# Patient Record
Sex: Male | Born: 1960 | Hispanic: Yes | Smoking: Former smoker
Health system: Southern US, Community
[De-identification: ages and names within clinical notes are randomized; demographics above are authoritative.]

## PROBLEM LIST (undated history)

## (undated) DIAGNOSIS — Z951 Presence of aortocoronary bypass graft: Secondary | ICD-10-CM

## (undated) DIAGNOSIS — C9590 Leukemia, unspecified not having achieved remission: Secondary | ICD-10-CM

## (undated) HISTORY — PX: CORONARY ARTERY BYPASS GRAFT: SHX141

## (undated) HISTORY — PX: KNEE SURGERY: SHX244

## (undated) HISTORY — PX: SHOULDER SURGERY: SHX246

---

## 2014-09-09 ENCOUNTER — Emergency Department: Payer: Self-pay | Admitting: Emergency Medicine

## 2014-09-09 LAB — COMPREHENSIVE METABOLIC PANEL
ALK PHOS: 142 U/L — AB
ANION GAP: 8 (ref 7–16)
Albumin: 3.5 g/dL (ref 3.4–5.0)
BILIRUBIN TOTAL: 0.4 mg/dL (ref 0.2–1.0)
BUN: 16 mg/dL (ref 7–18)
CALCIUM: 8.2 mg/dL — AB (ref 8.5–10.1)
CHLORIDE: 103 mmol/L (ref 98–107)
CREATININE: 1.13 mg/dL (ref 0.60–1.30)
Co2: 27 mmol/L (ref 21–32)
EGFR (African American): >60
GLUCOSE: 104 mg/dL — AB (ref 65–99)
Osmolality: 277 (ref 275–301)
POTASSIUM: 3.6 mmol/L (ref 3.5–5.1)
SGOT(AST): 25 U/L (ref 15–37)
SGPT (ALT): 26 U/L
Sodium: 138 mmol/L (ref 136–145)
Total Protein: 7.8 g/dL (ref 6.4–8.2)

## 2014-09-09 LAB — CBC
HCT: 34.6 % — ABNORMAL LOW (ref 40.0–52.0)
HGB: 10.6 g/dL — ABNORMAL LOW (ref 13.0–18.0)
MCH: 23.6 pg — AB (ref 26.0–34.0)
MCHC: 30.7 g/dL — AB (ref 32.0–36.0)
MCV: 77 fL — AB (ref 80–100)
PLATELETS: 249 10*3/uL (ref 150–440)
RBC: 4.49 10*6/uL (ref 4.40–5.90)
RDW: 18 % — AB (ref 11.5–14.5)
WBC: 4.6 10*3/uL (ref 3.8–10.6)

## 2014-09-09 LAB — CK TOTAL AND CKMB (NOT AT ARMC)
CK, TOTAL: 166 U/L (ref 39–308)
CK-MB: 1 ng/mL (ref 0.5–3.6)

## 2014-09-09 LAB — TROPONIN I: Troponin-I: <0.02 ng/mL

## 2014-09-09 LAB — PROTIME-INR
INR: 1
PROTHROMBIN TIME: 13.1 s (ref 11.5–14.7)

## 2014-09-09 LAB — APTT: Activated PTT: 33.5 secs (ref 23.6–35.9)

## 2014-09-09 LAB — LIPASE, BLOOD: Lipase: 191 U/L (ref 73–393)

## 2014-09-09 LAB — MAGNESIUM: Magnesium: 1.7 mg/dL — ABNORMAL LOW

## 2014-09-10 LAB — TROPONIN I: Troponin-I: <0.02 ng/mL

## 2014-09-16 ENCOUNTER — Emergency Department: Payer: Self-pay | Admitting: Internal Medicine

## 2016-04-25 IMAGING — CR DG SHOULDER 3+V*L*
1 series · 3 of 3 positions shown · non-contrast
Comparison: None

CLINICAL DATA: Fell from skateboard today onto LEFT side, LEFT
shoulder pain, limited range of motion, initial encounter

EXAM:
DG SHOULDER 3+VIEWS LEFT

[Series 1: dxr shoulder left complete · 0.14mm/px · 3 of 3 slices shown]
[im 1/3]
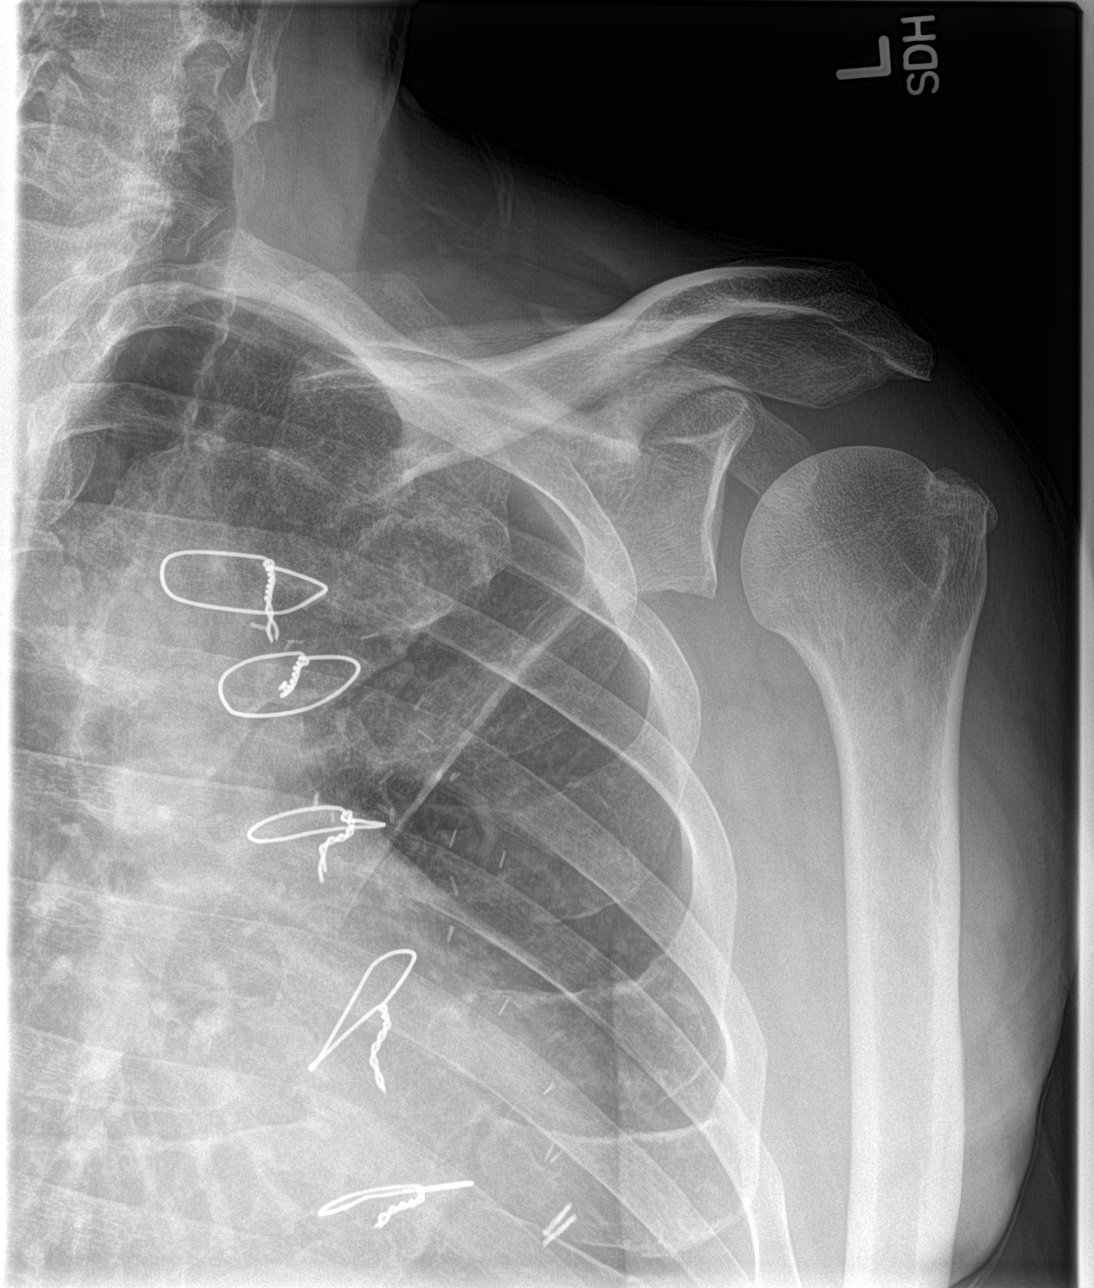
[im 2/3]
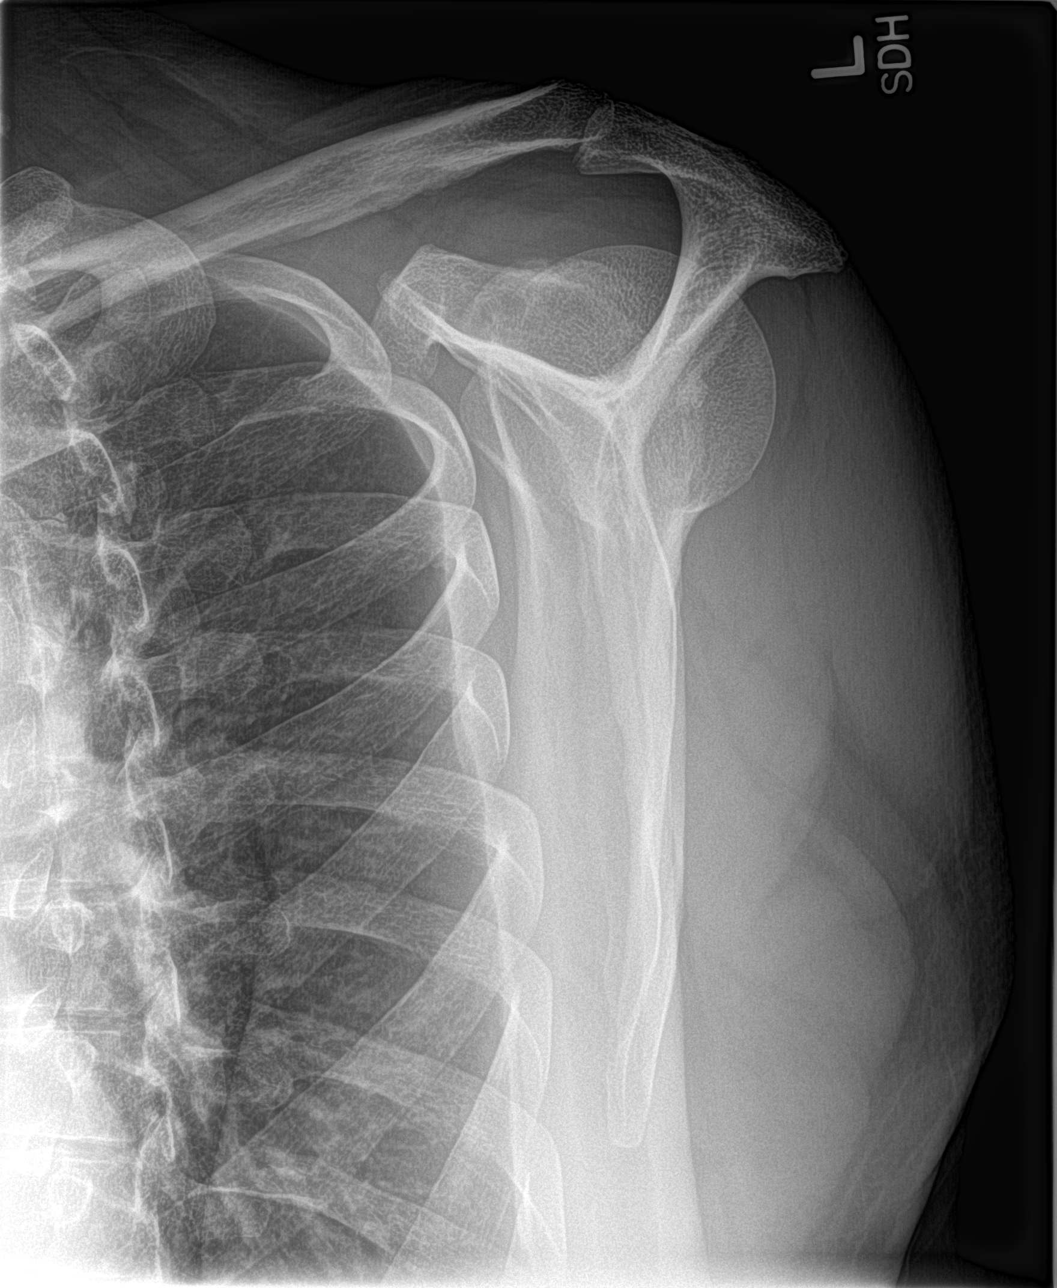
[im 3/3]
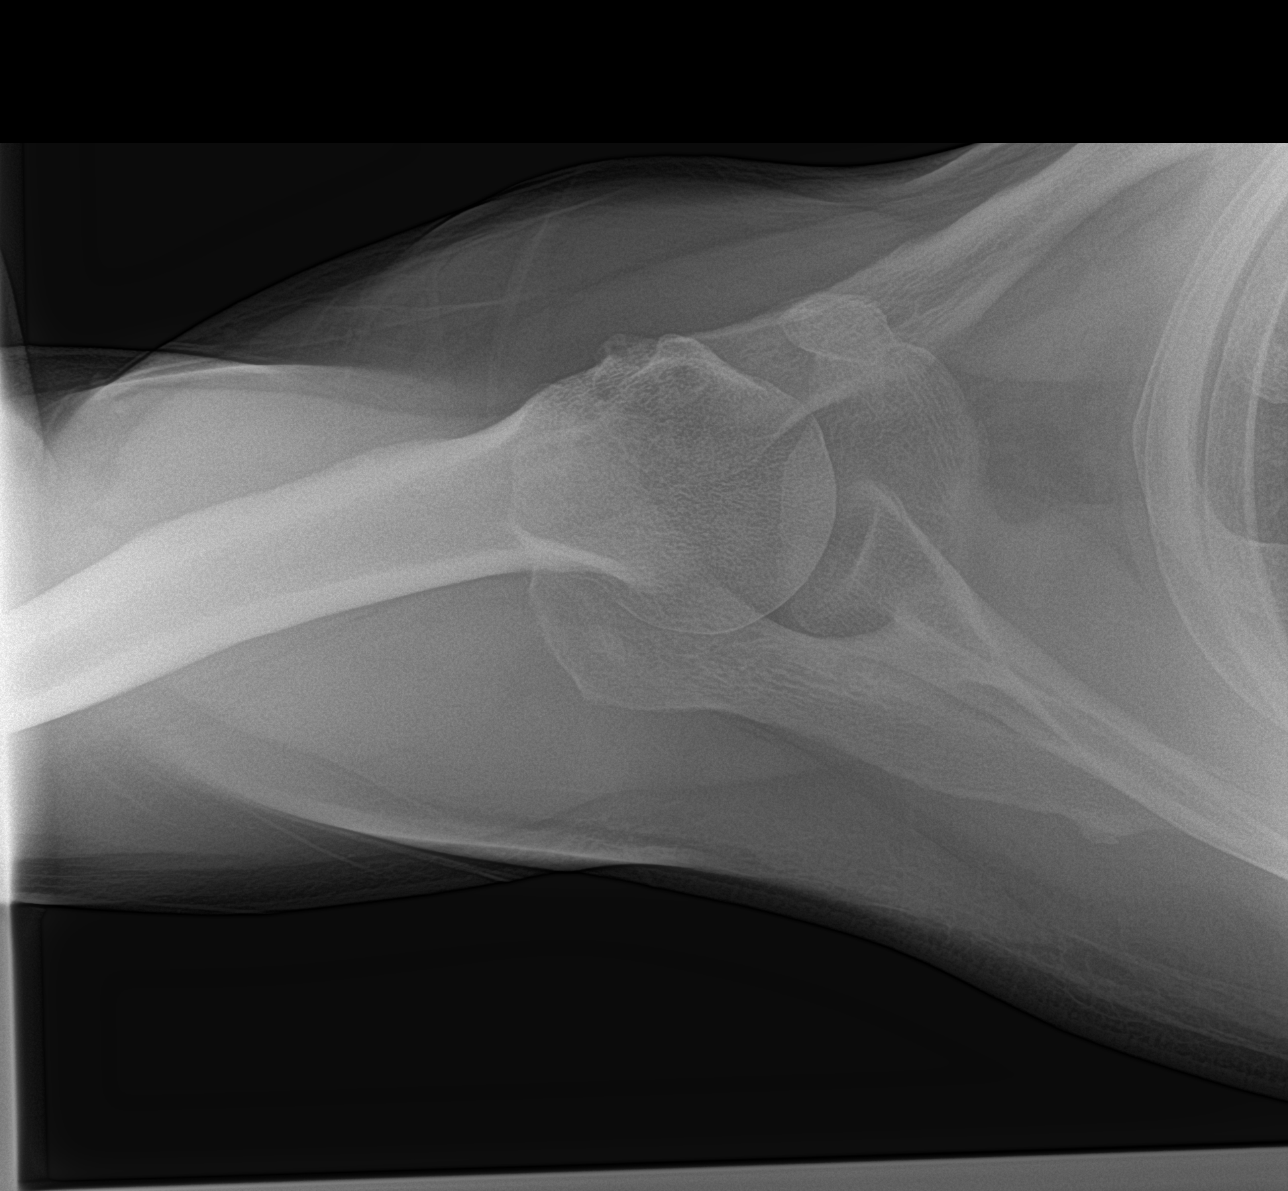

[3 of 3 positions shown; findings below may reference images not displayed]

FINDINGS: Osseous mineralization probably normal for technique.

AC joint alignment normal.

No acute fracture, dislocation, or bone destruction.

Visualized LEFT ribs intact.

Prior median sternotomy.
IMPRESSION: No acute LEFT shoulder abnormalities.

## 2019-09-20 ENCOUNTER — Encounter (HOSPITAL_COMMUNITY): Payer: Self-pay | Admitting: Emergency Medicine

## 2019-09-20 ENCOUNTER — Emergency Department (HOSPITAL_COMMUNITY)
Admission: EM | Admit: 2019-09-20 | Discharge: 2019-09-20 | Disposition: A | Discharge status: Home / Self Care | Payer: Self-pay | Attending: Emergency Medicine | Admitting: Emergency Medicine

## 2019-09-20 ENCOUNTER — Other Ambulatory Visit: Payer: Self-pay

## 2019-09-20 DIAGNOSIS — Z856 Personal history of leukemia: Secondary | ICD-10-CM | POA: Insufficient documentation

## 2019-09-20 DIAGNOSIS — Z951 Presence of aortocoronary bypass graft: Secondary | ICD-10-CM | POA: Insufficient documentation

## 2019-09-20 DIAGNOSIS — K649 Unspecified hemorrhoids: Secondary | ICD-10-CM | POA: Insufficient documentation

## 2019-09-20 DIAGNOSIS — R42 Dizziness and giddiness: Secondary | ICD-10-CM | POA: Insufficient documentation

## 2019-09-20 DIAGNOSIS — Z79899 Other long term (current) drug therapy: Secondary | ICD-10-CM | POA: Insufficient documentation

## 2019-09-20 HISTORY — DX: Presence of aortocoronary bypass graft: Z95.1

## 2019-09-20 HISTORY — DX: Leukemia, unspecified not having achieved remission: C95.90

## 2019-09-20 LAB — CBC
HCT: 34.6 % — ABNORMAL LOW (ref 39.0–52.0)
Hemoglobin: 10.5 g/dL — ABNORMAL LOW (ref 13.0–17.0)
MCH: 27.1 pg (ref 26.0–34.0)
MCHC: 30.3 g/dL (ref 30.0–36.0)
MCV: 89.2 fL (ref 80.0–100.0)
Platelets: 325 10*3/uL (ref 150–400)
RBC: 3.88 MIL/uL — ABNORMAL LOW (ref 4.22–5.81)
RDW: 15.3 % (ref 11.5–15.5)
WBC: 7.2 10*3/uL (ref 4.0–10.5)
nRBC: 0 % (ref 0.0–0.2)

## 2019-09-20 LAB — COMPREHENSIVE METABOLIC PANEL
ALT: 20 U/L (ref 0–44)
AST: 25 U/L (ref 15–41)
Albumin: 4.5 g/dL (ref 3.5–5.0)
Alkaline Phosphatase: 80 U/L (ref 38–126)
Anion gap: 11 (ref 5–15)
BUN: 11 mg/dL (ref 6–20)
CO2: 24 mmol/L (ref 22–32)
Calcium: 9.9 mg/dL (ref 8.9–10.3)
Chloride: 105 mmol/L (ref 98–111)
Creatinine, Ser: 0.93 mg/dL (ref 0.61–1.24)
GFR calc Af Amer: >60 mL/min (ref >60)
GFR calc non Af Amer: >60 mL/min (ref >60)
Glucose, Bld: 118 mg/dL — ABNORMAL HIGH (ref 70–99)
Potassium: 4.2 mmol/L (ref 3.5–5.1)
Sodium: 140 mmol/L (ref 135–145)
Total Bilirubin: 0.8 mg/dL (ref 0.3–1.2)
Total Protein: 8.1 g/dL (ref 6.5–8.1)

## 2019-09-20 LAB — TYPE AND SCREEN
ABO/RH(D): O POS
Antibody Screen: NEGATIVE

## 2019-09-20 LAB — ABO/RH: ABO/RH(D): O POS

## 2019-09-20 NOTE — Discharge Instructions (Signed)
Stop Plavix for 7 days.  Cmo tomar un bao de asiento How to Take a [employer] Un bao de asiento es un bao de agua tibia que se puede usar para cuidar el recto, la zona genital o la zona entre el recto y los genitales (perineo). En un bao de asiento, el agua solamente llega [employer] caderas y [place] las nalgas. Un bao de asiento puede [employer] hogar en la baera o en una tina porttil para bao de asiento que se coloca sobre el inodoro. Su mdico puede recomendar un bao de asiento para ayudarlo con lo siguiente:  Best boy y las molestias despus de dar a Actuary.  Aliviar el dolor y la picazn causados por las hemorroides o las fisuras anales.  Aliviar el dolor despus de determinadas cirugas.  Relajar los msculos doloridos o tensos. Cmo tomar un bao de [employer] 3 o 4baos de asiento diarios o tantos como se lo haya indicado el mdico. Bao de asiento en la baera Para tomar un bao de asiento en una baera: 1. Llene parte de la baera con agua tibia. El agua debe tener la profundidad suficiente para cubrirle las caderas y las nalgas cuando est sentado en la baera. 2. Si su mdico le indic que ponga medicamentos en el agua, siga sus instrucciones. 3. Sintese en el agua. 4. Sherlon Handing un poco el drenaje de la baera y djelo abierto durante su bao. 5. Abra el agua tibia nuevamente, lo suficiente para reponer Youth worker. Deje correr el agua durante todo su bao. Esto ayuda a Engineer, manufacturing systems en el nivel adecuado y a Scientist, forensic. 6. Sumrjase en el agua entre 15 y 31 minutos, o el tiempo que le haya indicado el mdico. 7. Cuando termine, tenga cuidado al ponerse de pie. Puede sentirse mareado. 8. Luego del bao de asiento, squese con golpecitos suaves. No frote la piel para secarla.  Bao de asiento sobre el inodoro Para tomar un bao de asiento con un recipiente sobre el inodoro: 1. Siga las instrucciones del fabricante. 2. Llene el recipiente  con agua tibia. 3. Si su mdico le indic que ponga medicamentos en el agua, siga sus instrucciones. 4. Sintese en el asiento. Asegrese de que el agua le cubra las nalgas y el perineo. 5. Sumrjase en el agua entre 15 y 58 minutos, o el tiempo que le haya indicado el mdico. 6. Luego del bao de asiento, squese con golpecitos suaves. No frote la piel para secarla. 7. Limpie y seque la tina despus de cada uso. 8. Deseche el recipiente si se agrieta o segn las instrucciones del fabricante. Comunquese con un mdico si:  Los sntomas empeoran. No contine con los baos de asiento si sus sntomas empeoran.  Aparecen nuevos sntomas. Si esto ocurre, no contine con los baos de asiento hasta hablar con su mdico. Resumen  Un bao de asiento es un bao con agua tibia en el cual el agua solo le llega hasta la cadera y cubre las nalgas.  Un bao de asiento puede [employer] picazn y Conservation officer, historic buildings, y Media planner los msculos doloridos o tensos en la parte inferior del cuerpo, incluida la zona genital.  Fidelis 3 o 4baos de asiento diarios o tantos como se lo haya indicado el mdico. Sumrjase en el agua entre 15 y 42 minutos.  No contine con los baos de asiento si los sntomas empeoran. Esta informacin no tiene Marine scientist el consejo del mdico. Asegrese de  hacerle al mdico cualquier pregunta que tenga. Document Released: 10/21/2006 Document Revised: 10/18/2017 Document Reviewed: 10/18/2017 Elsevier Patient Education  2020 [place] hemorroides son venas inflamadas que pueden desarrollarse:  En el ano (recto). Estas se denominan hemorroides internas.  Alrededor de la abertura del ano. Estas se denominan hemorroides externas. Las hemorroides pueden causar dolor, picazn o hemorragias. Generalmente no causan problemas graves. Con frecuencia mejoran al [employer] dieta, el estilo de vida y otros tratamientos Financial planner. Cules son las causas? Esta  afeccin puede ser causada por lo siguiente:  Tener dificultad para defecar (estreimiento).  Hacer mucha fuerza (esfuerzo) para defecar.  Materia fecal lquida (diarrea).  Embarazo.  Tener mucho sobrepeso (obesidad).  Estar sentado durante largos perodos de [place].  Levantar objetos pesados u otras actividades que impliquen esfuerzo.  Sexo anal.  Andar en bicicleta por un largo perodo de tiempo. Cules son los signos o los sntomas? Los sntomas de esta afeccin incluyen los siguientes:  Social research officer, government.  Picazn o irritacin en el ano.  Sangrado proveniente del ano.  Prdida de materia fecal.  Inflamacin en la zona.  Uno o ms bultos alrededor de la abertura del ano. Cmo se diagnostica? A menudo un mdico puede diagnosticar esta afeccin al observar la zona afectada. El mdico tambin puede:  Optometrist un examen que implica palpar la zona con la mano enguantada (examen rectal digital).  Examinar el interior de la zona anal utilizando un pequeo tubo (anoscopio).  Pedir anlisis de [place]. Es posible que esto se realice si ha perdido Optometrist.  Solicitarle un estudio que consiste en la observacin del interior del colon utilizando un tubo flexible con una cmara en el extremo (sigmoidoscopia o colonoscopa). Cmo se trata? Esta afeccin generalmente se puede tratar en el hogar. El mdico puede indicarle que cambie de Designer, multimedia, de estilo de vida o que trate de Physicist, medical. Si esto no da resultado, se pueden realizar procedimientos para extirpar las hemorroides o reducir Publishing rights manager. Estos pueden implicar lo siguiente:  Building services engineer en la base de las hemorroides para interrumpir la irrigacin de Herbalist.  Inyectar un medicamento en las hemorroides para reducir Publishing rights manager.  Dirigir un tipo de Teacher, early years/pre de luz hacia las hemorroides para Field seismologist que se caigan.  Realizar Ardelia Mems ciruga para extirpar las hemorroides o cortar la irrigacin de  [place]. Siga estas indicaciones en su casa: Comida y bebida   Consuma alimentos con alto contenido de [place]. Entre ellos cereales integrales, frijoles, frutos secos, frutas y verduras.  Pregntele a su mdico acerca de tomar productos con fibra aadida en ellos (complementos defibra).  Disminuya la cantidad de grasa de la dieta. Para esto, puede hacer lo siguiente: ? Coma productos lcteos descremados. ? Coma menos carne roja. ? No consuma alimentos procesados.  Beba suficiente lquido para Consulting civil engineer orina de color amarillo plido. Control del dolor y [place] un bao de agua tibia (bao de asiento) durante 20 minutos para Best boy. Hgalo 3 o 4veces al da. Puede hacer esto en una baera o usar un dispositivo porttil para bao de asiento que se coloca sobre el inodoro.  Si se lo indican, aplique hielo sobre la zona dolorida. Puede ser beneficioso aplicar hielo [employer] baos con agua tibia. ? Ponga el hielo en una bolsa plstica. ? Coloque una [employer] piel y Therapist, nutritional. ? Coloque el hielo durante 73minutos, 2 a 3veces por da.  Indicaciones generales  [employer] de venta libre y los recetados solamente como se lo haya indicado el mdico. ? Las [employer] y los medicamentos pueden usarse como se lo hayan indicado.  Haga ejercicio fsico con frecuencia. Consulte al mdico qu tipos de ejercicios son mejores para usted y qu cantidad.  Vaya al bao cuando sienta ganas de defecar. No espere.  Evite hacer demasiada fuerza al defecar.  Mantenga el ano seco y limpio. Use papel higinico hmedo o toallitas humedecidas despus de defecar.  No pase mucho tiempo sentado en el inodoro.  Concurra a todas las visitas de seguimiento como se lo haya indicado el mdico. Esto es importante. Comunquese con un mdico si:  Tiene dolor e hinchazn que no mejoran con el tratamiento o los medicamentos.  Tiene problemas para defecar.  No puede  defecar.  Tiene dolor o hinchazn en la zona exterior de las hemorroides. Solicite ayuda inmediatamente si tiene:  Hemorragia que no se detiene. Resumen  Las hemorroides son venas hinchadas en el ano o la zona que rodea el ano.  Pueden causar dolor, picazn o sangrado.  Consuma alimentos con alto contenido de [place]. Entre ellos cereales integrales, frijoles, frutos secos, frutas y verduras.  Tome un bao de agua tibia (bao de asiento) durante 20 minutos para Best boy. Hgalo 3 o 4veces al da. Esta informacin no tiene Marine scientist el consejo del mdico. Asegrese de hacerle al mdico cualquier pregunta que tenga. Document Released: 01/04/2013 Document Revised: 03/19/2018 Document Reviewed: 03/19/2018 Elsevier Patient Education  2020 [place] hemorroides son venas inflamadas que pueden desarrollarse:  En el ano (recto). Estas se denominan hemorroides internas.  Alrededor de la abertura del ano. Estas se denominan hemorroides externas. Las hemorroides pueden causar dolor, picazn o hemorragias. Generalmente no causan problemas graves. Con frecuencia mejoran al [employer] dieta, el estilo de vida y otros tratamientos Financial planner. Cules son las causas? Esta afeccin puede ser causada por lo siguiente:  Tener dificultad para defecar (estreimiento).  Hacer mucha fuerza (esfuerzo) para defecar.  Materia fecal lquida (diarrea).  Embarazo.  Tener mucho sobrepeso (obesidad).  Estar sentado durante largos perodos de [place].  Levantar objetos pesados u otras actividades que impliquen esfuerzo.  Sexo anal.  Andar en bicicleta por un largo perodo de tiempo. Cules son los signos o los sntomas? Los sntomas de esta afeccin incluyen los siguientes:  Social research officer, government.  Picazn o irritacin en el ano.  Sangrado proveniente del ano.  Prdida de materia fecal.  Inflamacin en la zona.  Uno o ms bultos alrededor de la  abertura del ano. Cmo se diagnostica? A menudo un mdico puede diagnosticar esta afeccin al observar la zona afectada. El mdico tambin puede:  Optometrist un examen que implica palpar la zona con la mano enguantada (examen rectal digital).  Examinar el interior de la zona anal utilizando un pequeo tubo (anoscopio).  Pedir anlisis de [place]. Es posible que esto se realice si ha perdido Optometrist.  Solicitarle un estudio que consiste en la observacin del interior del colon utilizando un tubo flexible con una cmara en el extremo (sigmoidoscopia o colonoscopa). Cmo se trata? Esta afeccin generalmente se puede tratar en el hogar. El mdico puede indicarle que cambie de Designer, multimedia, de estilo de vida o que trate de Physicist, medical. Si esto no da resultado, se pueden realizar procedimientos para extirpar las hemorroides o reducir Publishing rights manager. Estos pueden implicar lo siguiente:  Glass blower/designer  bandas elsticas en la base de las hemorroides para interrumpir la irrigacin de la [place].  Inyectar un medicamento en las hemorroides para reducir Publishing rights manager.  Dirigir un tipo de Teacher, early years/pre de luz hacia las hemorroides para Field seismologist que se caigan.  Realizar Ardelia Mems ciruga para extirpar las hemorroides o cortar la irrigacin de [place]. Siga estas indicaciones en su casa: Comida y bebida   Consuma alimentos con alto contenido de [employer]. Entre ellos cereales integrales, frijoles, frutos secos, frutas y verduras.  Pregntele a su mdico acerca de tomar productos con fibra aadida en ellos (complementos defibra).  Disminuya la cantidad de grasa de la dieta. Para esto, puede hacer lo siguiente: ? Coma productos lcteos descremados. ? Coma menos carne roja. ? No consuma alimentos procesados.  Beba suficiente lquido para Consulting civil engineer orina de color amarillo plido. Control del dolor y [place] un bao de agua tibia (bao de asiento) durante 20 minutos para Best boy.  Hgalo 3 o 4veces al da. Puede hacer esto en una baera o usar un dispositivo porttil para bao de asiento que se coloca sobre el inodoro.  Si se lo indican, aplique hielo sobre la zona dolorida. Puede ser beneficioso aplicar hielo [employer] baos con agua tibia. ? Ponga el hielo en una bolsa plstica. ? Coloque una [employer] piel y Therapist, nutritional. ? Coloque el hielo durante 68minutos, 2 a 3veces por da. Indicaciones generales  [employer] de venta libre y los recetados solamente como se lo haya indicado el mdico. ? Las [employer] y los medicamentos pueden usarse como se lo hayan indicado.  Haga ejercicio fsico con frecuencia. Consulte al mdico qu tipos de ejercicios son mejores para usted y qu cantidad.  Vaya al bao cuando sienta ganas de defecar. No espere.  Evite hacer demasiada fuerza al defecar.  Mantenga el ano seco y limpio. Use papel higinico hmedo o toallitas humedecidas despus de defecar.  No pase mucho tiempo sentado en el inodoro.  Concurra a todas las visitas de seguimiento como se lo haya indicado el mdico. Esto es importante. Comunquese con un mdico si:  Tiene dolor e hinchazn que no mejoran con el tratamiento o los medicamentos.  Tiene problemas para defecar.  No puede defecar.  Tiene dolor o hinchazn en la zona exterior de las hemorroides. Solicite ayuda inmediatamente si tiene:  Hemorragia que no se detiene. Resumen  Las hemorroides son venas hinchadas en el ano o la zona que rodea el ano.  Pueden causar dolor, picazn o sangrado.  Consuma alimentos con alto contenido de [place]. Entre ellos cereales integrales, frijoles, frutos secos, frutas y verduras.  Tome un bao de agua tibia (bao de asiento) durante 20 minutos para Best boy. Hgalo 3 o 4veces al da. Esta informacin no tiene Marine scientist el consejo del mdico. Asegrese de hacerle al mdico cualquier pregunta que tenga. Document Released:  01/04/2013 Document Revised: 03/19/2018 Document Reviewed: 03/19/2018 Elsevier Patient Education  2020 [place]  The rectum is the last foot of your colon, and it naturally stretches to hold stool.  Hemorrhoidal piles are natural clusters of blood vessels that help the rectum and anal canal stretch to hold stool and allow bowel movements to eliminate feces.   Hemorrhoids are abnormally swollen blood vessels in the rectum.  Too much pressure in the rectum causes hemorrhoids by forcing blood to stretch and bulge the walls of the veins, sometimes even rupturing them.  Hemorrhoids can become  like varicose veins you might see on a person's legs.  Most people will develop a flare of hemorrhoids in their lifetime.  When bulging hemorrhoidal veins are irritated, they can swell, burn, itch, cause pain, and bleed.  Most flares will calm down gradually own within a few weeks.  However, once hemorrhoids are created, they are difficult to get rid of completely and tend to flare more easily than the first flare.   Fortunately, good habits and simple medical treatment usually control hemorrhoids well, and surgery is needed only in severe cases. Types of Hemorrhoids:  Internal hemorrhoids usually don't initially hurt or itch; they are deep inside the rectum and usually have no sensation. If they begin to push out (prolapse), pain and burning can occur.  However, internal hemorrhoids can bleed.  Anal bleeding should not be ignored since bleeding could come from a dangerous source like colorectal cancer, so persistent rectal bleeding should be investigated by a doctor, sometimes with a colonoscopy.  External hemorrhoids cause most of the symptoms - pain, burning, and itching. Nonirritated hemorrhoids can look like small skin tags coming out of the anus.   Thrombosed hemorrhoids can form when a hemorrhoid blood vessel bursts and causes the hemorrhoid to suddenly swell.  A purple blood clot can form in it  and become an excruciatingly painful lump at the anus. Because of these unpleasant symptoms, immediate incision and drainage by a surgeon at an office visit can provide much relief of the pain.    PREVENTION Avoiding the most frequent causes listed below will prevent most cases of hemorrhoids: Constipation Hard stools Diarrhea  Constant sitting  Straining with bowel movements Sitting on the toilet for a long time  Severe coughing  episodes Pregnancy / Childbirth  Heavy Lifting  Sometimes avoiding the above triggers is difficult:  How can you avoid sitting all day if you have a seated job? Also, we try to avoid coughing and diarrhea, but sometimes it's beyond your control.  Still, there are some practical hints to help: Keep the anal and genital area clean.  Moistened tissues such as flushable wet wipes are less irritating than toilet paper.  Using irrigating showers or bottle irrigation washing gently cleans this sensitive area.   Avoid dry toilet paper when cleaning after bowel movements.  Marland Kitchen Keep the anal and genital area dry.  Lightly pat the rectal area dry.  Avoid rubbing.  Talcum or baby powders can help GET YOUR STOOLS SOFT.   This is the most important way to prevent irritated hemorrhoids.  Hard stools are like sandpaper to the anorectal canal and will cause more problems.  The goal: ONE SOFT BOWEL MOVEMENT A DAY!  BMs from every other day to 3 times a day is a tolerable range Treat coughing, diarrhea and constipation early since irritated hemorrhoids may soon follow.  If your main job activity is seated, always stand or walk during your breaks. Make it a point to stand and walk at least 5 minutes every hour and try to shift frequently in your chair to avoid direct rectal pressure.  Always exhale as you strain or lift. Don't hold your breath.  Do not delay or try to prevent a bowel movement when the urge is present. Exercise regularly (walking or jogging 60 minutes a day) to stimulate the  bowels to move. No reading or other activity while on the toilet. If bowel movements take longer than 5 minutes, you are too constipated. AVOID CONSTIPATION Drink plenty of liquids (1  1/2 to 2 quarts of water and other fluids a day unless fluid restricted for another medical condition). Liquids that contain caffeine (coffee a, tea, soft drinks) can be dehydrating and should be avoided until constipation is controlled. Consider minimizing milk, as dairy products may be constipating. Eat plenty of fiber (30g a day ideal, more if needed).  Fiber is the undigested part of plant food that passes into the colon, acting as "natures broom" to encourage bowel motility and movement.  Fiber can absorb and hold large amounts of water. This results in a larger, bulkier stool, which is soft and easier to pass.  Eating foods high in fiber - 12 servings - such as  Vegetables: Root (potatoes, carrots, turnips), Leafy green (lettuce, salad greens, celery, spinach), High residue (cabbage, broccoli, etc.) Fruit: Fresh, Dried (prunes, apricots, cherries), Stewed (applesauce)  Whole grain breads, pasta, whole wheat Bran cereals, muffins, etc. Consider adding supplemental bulking fiber which retains large volumes of water: Psyllium ground seeds (native plant from [place])--available as Metamucil, Konsyl, Effersyllium, Per Diem Fiber, or the less expensive generic forms.  Citrucel  (methylcellulose wood fiber) . FiberCon (Polycarbophil) Polyethylene Glycol - and "artificial" fiber commonly called Miralax or Glycolax.  It is helpful for people with gassy or bloated feelings with regular fiber Flax Seed - a less gassy natural fiber  Laxatives can be useful for a short period if constipation is severe Osmotics (Milk of Magnesia, Fleets Phospho-Soda, Magnesium Citrate)  Stimulants (Senokot,   Castor Oil,  Dulcolax, Ex-Lax)    Laxatives are not a good long-term solution as it can stress the bowels and cause too much  mineral loss and dehydration.   Avoid taking laxatives for more than 7 days in a row.  AVOID DIARRHEA Switch to liquids and simpler foods for a few days to avoid stressing your intestines further. Avoid dairy products (especially milk & ice cream) for a short time.  The intestines often can lose the ability to digest lactose when stressed. Avoid foods that cause gassiness or bloating.  Typical foods include beans and other legumes, cabbage, broccoli, and dairy foods.  Every person has some sensitivity to other foods, so listen to your body and avoid those foods that trigger problems for you. Adding fiber (Citrucel, Metamucil, FiberCon, Flax seed, Miralax) gradually can help thicken stools by absorbing excess fluid and retrain the intestines to act more normally.  Slowly increase the dose over a few weeks.  Too much fiber too soon can backfire and cause cramping & bloating. Probiotics (such as active yogurt, Align, etc) may help repopulate the intestines and colon with normal bacteria and calm down a sensitive digestive tract.  Most studies show it to be of mild help, though, and such products can be costly. Medicines: Bismuth subsalicylate (ex. Kayopectate, Pepto Bismol) every 30 minutes for up to 6 doses can help control diarrhea.  Avoid if pregnant. Loperamide (Immodium) can slow down diarrhea.  Start with two tablets (4mg  total) first and then try one tablet every 6 hours.  Avoid if you are having fevers or severe pain.  If you are not better or start feeling worse, stop all medicines and call your doctor for advice Call your doctor if you are getting worse or not better.  Sometimes further testing (cultures, endoscopy, X-ray studies, bloodwork, etc) may be needed to help diagnose and treat the cause of the diarrhea.  TROUBLESHOOTING IRREGULAR BOWELS 1) Avoid extremes of bowel movements (no bad constipation/diarrhea) 2) Miralax 17gm mixed in 8oz.  water or juice-daily. May use BID as needed.  3)  Gas-x,Phazyme, etc. as needed for gas & bloating.  4) Soft,bland diet. No spicy,greasy,fried foods.  5) Prilosec over-the-counter as needed  6) May hold gluten/wheat products from diet to see if symptoms improve.  7)  May try probiotics (Align, Activa, etc) to help calm the bowels down 7) If symptoms become worse call back immediately.   TREATMENT OF HEMORRHOID FLARE If these preventive measures fail, you must take action right away! Hemorrhoids are one condition that can be mild in the morning and become intolerable by nightfall. Most hemorrhoidal flares take several weeks to calm down.  These suggestions can help: Warm soaks.  This helps more than any topical medication.  Use up to 8 times a day.  Usually sitz baths or sitting in a warm bathtub helps.  Sitting on moist warm towels are helpful.  Switching to ice packs/cool compresses can be helpful  Use a Sitz Bath 4-8 times a day for relief A sitz bath is a warm water bath taken in the sitting position that covers only the hips and buttocks. It may be used for either healing or hygiene purposes. Sitz baths are also used to relieve pain, itching, or muscle spasms. The water may contain medicine. Moist heat will help you heal and relax.  HOME CARE INSTRUCTIONS  Take 3 to 4 sitz baths a day. 1. Fill the bathtub half full with warm water. 2. Sit in the water and open the drain a little. 3. Turn on the warm water to keep the tub half full. Keep the water running constantly. 4. Soak in the water for 15 to 20 minutes. 5. After the sitz bath, pat the affected area dry first. SEEK MEDICAL CARE IF:  You get worse instead of better. Stop the sitz baths if you get worse.  Normalize your bowels.  Extremes of diarrhea or constipation will make hemorrhoids worse.  One soft bowel movement a day is the goal.  Fiber can help get your bowels regular Wet wipes instead of toilet paper Pain control with a NSAID such as ibuprofen (Advil) or naproxen (Aleve) or  acetaminophen (Tylenol) around the clock.  Narcotics are constipating and should be minimized if possible Topical creams contain steroids (bydrocortisone) or local anesthetic (xylocaine) can help make pain and itching more tolerable.   EVALUATION If hemorrhoids are still causing problems, you could benefit by an evaluation by a surgeon.  The surgeon will obtain a history and examine you.  If hemorrhoids are diagnosed, some therapies can be offered in the office, usually with an anoscope into the less sensitive area of the rectum: -injection of hemorrhoids (sclerotherapy) can scar the blood vessels of the swollen/enlarged hemorrhoids to help shrink them down to a more normal size -rubber banding of the enlarged hemorrhoids to help shrink them down to a more normal size -drainage of the blood clot causing a thrombosed hemorrhoid,  to relieve the severe pain   While 90% of the time such problems from hemorrhoids can be managed without preceding to surgery, sometimes the hemorrhoids require a operation to control the problem (uncontrolled bleeding, prolapse, pain, etc.).   This involves being placed under general anesthesia where the surgeon can confirm the diagnosis and remove, suture, or staple the hemorrhoid(s).  Your surgeon can help you treat the problem appropriately.

## 2019-09-20 NOTE — ED Provider Notes (Signed)
[facility] EMERGENCY DEPARTMENT Provider Note   CSN: [id number] Arrival date & time: 09/20/19  0944     History Chief Complaint  Patient presents with  . Hemorrhoids  . Rectal Bleeding    Antiwan Sobalvarro is a 58 y.o. male. Patient speaks Spanish primarily and translator iPad was used. HPI Patient presents with severe rectal pain.  States he has hemorrhoids.  States he has had a lot of blood coming out both with stool and but out.  States he thinks the blood may be mixed with the stool.  States he has been eating and drinking less because he does not want to have a bowel movement.  States he feels lightheaded.  Also states he has a history of leukemia and is on medicine for that.  States he is on Plavix due to a recent right-sided shoulder surgery.  No abdominal pain.  States he does not know what his baseline hemoglobin is.  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    Past Medical History:  Diagnosis Date  . Hx of CABG   . Leukemia ([place])     There are no problems to display for this patient.   Past Surgical History:  Procedure Laterality Date  . CORONARY ARTERY BYPASS GRAFT    . KNEE SURGERY Right   . SHOULDER SURGERY Right        No family history on file.  Social History   Tobacco Use  . Smoking status: Former Research scientist (life sciences)  . Smokeless tobacco: Never Used  Substance Use Topics  . Alcohol use: Not Currently  . Drug use: Never    Home Medications Prior to Admission medications   Not on File    Allergies    Patient has no allergy information on record.  Review of Systems   Review of Systems  Constitutional: Negative for appetite change.  HENT: Negative for congestion.   Respiratory: Negative for shortness of breath.   Cardiovascular: Negative for chest pain.  Gastrointestinal: Positive for blood in stool and rectal pain.  Genitourinary: Negative for flank pain.  Musculoskeletal: Negative for arthralgias.  Skin:  Negative for rash.  Neurological: Positive for light-headedness.  Psychiatric/Behavioral: Negative for confusion.    Physical Exam Updated Vital Signs BP (!) 147/91 (BP Location: Right Arm)   Pulse 99   Temp 98.4 F (36.9 C) (Oral)   Resp 20   SpO2 99%   Physical Exam Vitals and nursing note reviewed.  HENT:     Head: Normocephalic.  Cardiovascular:     Rate and Rhythm: Tachycardia present.  Pulmonary:     Breath sounds: No wheezing or rhonchi.  Abdominal:     Tenderness: There is no abdominal tenderness.  Genitourinary:    Comments: 2 large hemorrhoids.  The one on the left side has stigmata of recent bleeding and is more tender than the contralateral. Skin:    General: Skin is warm.     Capillary Refill: Capillary refill takes less than 2 seconds.  Neurological:     Mental Status: He is alert and oriented to person, place, and time.     ED Results / Procedures / Treatments   Labs (all labs ordered are listed, but only abnormal results are displayed) Labs Reviewed  COMPREHENSIVE METABOLIC PANEL - Abnormal; Notable for the following components:      Result Value   Glucose, Bld 118 (*)    All other components within normal limits  CBC - Abnormal; Notable  for the following components:   RBC 3.88 (*)    Hemoglobin 10.5 (*)    HCT 34.6 (*)    All other components within normal limits  POC OCCULT BLOOD, ED  TYPE AND SCREEN  ABO/RH    EKG None  Radiology No results found.  Procedures Procedures (including critical care time)  Medications Ordered in ED Medications - No data to display  ED Course  I have reviewed the triage vital signs and the nursing notes.  Pertinent labs & imaging results that were available during my care of the patient were reviewed by me and considered in my medical decision making (see chart for details).    MDM Rules/Calculators/A&P                      Patient had large bleeding hemorrhoids.  Seen by general surgery.  Given  more instructions.  He needs admission at this time.  Patient feels somewhat better.  Will discharge home. Final Clinical Impression(s) / ED Diagnoses Final diagnoses:  Hemorrhoids, unspecified hemorrhoid type    Rx / DC Orders ED Discharge Orders    None       Davonna Belling, MD 09/20/19 1635

## 2019-09-20 NOTE — Progress Notes (Signed)
[facility] Consult Note  Jeffery Monroe April 01, 1961  [id number].    Requesting MD: Davonna Belling Chief Complaint:  Bleeding hemorrhoid Reason for Consult: Same   HPI:  Pt is a 58 y/o male who presents with bleeding hemorrhoids.  He is visiting his son from [place].  He started having bleeding last Thursday, 09/16/2019.  He has been bleeding daily since.  He has issues with chronic constipation, which is ongoing for many years.   Work-up in the ED shows blood pressure is 152/82 vital signs are stable.  Of the long.  CMP is normal except for glucose of 119, WBC 7.2, hemoglobin 10.5, hematocrit 34.6, platelets are 325,000.  Prior to Admission medications   Patient reports being on Plavix and some type of medicine for leukemia.     ROS: Review of Systems  Constitutional: Negative.   HENT: Negative.   Eyes: Negative.   Respiratory: Negative.   Cardiovascular: Negative.   Gastrointestinal: Positive for blood in stool and constipation.  Genitourinary: Negative.   Musculoskeletal: Negative.   Skin: Negative.   Neurological: Negative.   Psychiatric/Behavioral: Negative.     No family history on file.  Past Medical History:  Diagnosis Date  . Hx of CABG   . Leukemia [facility])     Past Surgical History:  Procedure Laterality Date  . CORONARY ARTERY BYPASS GRAFT    . KNEE SURGERY Right   . SHOULDER SURGERY Right     Social History:  reports that he has quit smoking. He has never used smokeless tobacco. He reports previous alcohol use. He reports that he does not use drugs. EtOH: None x17 years Drugs: None Tobacco: 27-pack-year history none x17 years. Allergies: Not on File  Home meds: Norvasc 5 mg daily, atorvastatin 40 mg daily, Plavix 75 mg daily ezetimibe 10 mg daily, Imdur 30 mg daily, Sprycel 75 mg daily  Blood pressure (!) 152/82, pulse (!) 109, temperature 98.4 F (36.9 C), temperature source Oral, resp. rate 14, SpO2 98 %. Physical  Exam: Physical Exam Vitals reviewed.  Constitutional:      General: He is not in acute distress.    Appearance: Normal appearance. He is normal weight. He is not ill-appearing, toxic-appearing or diaphoretic.  HENT:     Head: Normocephalic.     Mouth/Throat:     Mouth: Mucous membranes are moist.  Eyes:     General: No scleral icterus.    Conjunctiva/sclera: Conjunctivae normal.     Comments: Pupils are equal  Neck:     Vascular: No carotid bruit.  Cardiovascular:     Rate and Rhythm: Normal rate and regular rhythm.     Pulses: Normal pulses.     Heart sounds: No murmur.  Pulmonary:     Effort: Pulmonary effort is normal.     Breath sounds: Normal breath sounds.  Abdominal:     General: Abdomen is flat. Bowel sounds are normal.     Palpations: Abdomen is soft.  Musculoskeletal:     Cervical back: Normal range of motion and neck supple. No rigidity or tenderness.  Lymphadenopathy:     Cervical: No cervical adenopathy.  Skin:    General: Skin is warm and dry.     Capillary Refill: Capillary refill takes less than 2 seconds.  Neurological:     General: No focal deficit present.     Mental Status: He is alert and oriented to person, place, and time.     Cranial Nerves: No cranial nerve deficit.  Psychiatric:        Mood and Affect: Mood normal.        Behavior: Behavior normal.        Thought Content: Thought content normal.        Judgment: Judgment normal.     Results for orders placed or performed during the hospital encounter of 09/20/19 (from the past 48 hour(s))  Comprehensive metabolic panel     Status: Abnormal   Collection Time: 09/20/19 10:15 AM  Result Value Ref Range   Sodium 140 135 - 145 mmol/L   Potassium 4.2 3.5 - 5.1 mmol/L   Chloride 105 98 - 111 mmol/L   CO2 24 22 - 32 mmol/L   Glucose, Bld 118 (H) 70 - 99 mg/dL   BUN 11 6 - 20 mg/dL   Creatinine, Ser 0.93 0.61 - 1.24 mg/dL   Calcium 9.9 8.9 - 10.3 mg/dL   Total Protein 8.1 6.5 - 8.1 g/dL    Albumin 4.5 3.5 - 5.0 g/dL   AST 25 15 - 41 U/L   ALT 20 0 - 44 U/L   Alkaline Phosphatase 80 38 - 126 U/L   Total Bilirubin 0.8 0.3 - 1.2 mg/dL   GFR calc non Af Amer >60 >60 mL/min   GFR calc Af Amer >60 >60 mL/min   Anion gap 11 5 - 15    Comment: Performed at [facility], [address] [place], [place] [address]  CBC     Status: Abnormal   Collection Time: 09/20/19 10:15 AM  Result Value Ref Range   WBC 7.2 4.0 - 10.5 K/uL   RBC 3.88 (L) 4.22 - 5.81 MIL/uL   Hemoglobin 10.5 (L) 13.0 - 17.0 g/dL   HCT 34.6 (L) 39.0 - 52.0 %   MCV 89.2 80.0 - 100.0 fL   MCH 27.1 26.0 - 34.0 pg   MCHC 30.3 30.0 - 36.0 g/dL   RDW 15.3 11.5 - 15.5 %   Platelets 325 150 - 400 K/uL   nRBC 0.0 0.0 - 0.2 %    Comment: Performed at [facility], [place] [address] [place], [place] [address]  Type and screen [place]     Status: None   Collection Time: 09/20/19 10:27 AM  Result Value Ref Range   ABO/RH(D) O POS    Antibody Screen NEG    Sample Expiration      09/23/2019,2359 Performed at [facility], [place] [address], [place], [place] [address]    No results found.    Assessment/Plan  Bleeding hemorrhoids On Plavix for CABG 10 years ago Leukemia  History of tobacco use; quit17 years ago  Plan: We manually reduced his hemorrhoids.  He was seen and evaluated by Dr. Gurney Maxin.  We recommended he discontinue his Plavix for the next 7 days.  He is to go on MiraLAX once daily for the next 30 days.  After that he can transition over to Metamucil after that.  The aim is to have 1 or 2 soft bowel movements per day.  He is also to stay off the toilet as much as possible.  He is to go home and have a sitz bath and do this at least 4 times a day.  If his hemorrhoids come back out he is to go on a sitz bath and then try and reduce them again.  He can follow-up when he returns home to [place] with his primary care.  Earnstine Regal [facility]  Surgery 09/20/2019, 12:50 PM Please see Amion for pager number during day hours 7:00am-4:30pm

## 2019-09-20 NOTE — ED Triage Notes (Addendum)
Pt only speaks Spanish-- interpretor used  Rectal bleeding started 2 days ago -- with a hemorrhoid-- pain is 10/10. taking plavix -- recent surgery on right shoulder.   Hx of Leukemia - taking "a medicine to treat that"
